# Patient Record
Sex: Male | Born: 1985 | Race: Black or African American | Hispanic: No | Marital: Single | Smoking: Current every day smoker
Health system: Southern US, Community
[De-identification: ages and names within clinical notes are randomized; demographics above are authoritative.]

## PROBLEM LIST (undated history)

## (undated) ENCOUNTER — Ambulatory Visit: Admission: EM | Payer: Self-pay

## (undated) HISTORY — PX: WISDOM TOOTH EXTRACTION: SHX21

---

## 2017-12-23 ENCOUNTER — Emergency Department: Payer: Self-pay

## 2017-12-23 ENCOUNTER — Emergency Department
Admission: EM | Admit: 2017-12-23 | Discharge: 2017-12-23 | Disposition: A | Discharge status: Home / Self Care | Payer: Self-pay | Attending: Emergency Medicine | Admitting: Emergency Medicine

## 2017-12-23 DIAGNOSIS — N50812 Left testicular pain: Secondary | ICD-10-CM | POA: Insufficient documentation

## 2017-12-23 DIAGNOSIS — F17210 Nicotine dependence, cigarettes, uncomplicated: Secondary | ICD-10-CM | POA: Insufficient documentation

## 2017-12-23 DIAGNOSIS — N5082 Scrotal pain: Secondary | ICD-10-CM | POA: Insufficient documentation

## 2017-12-23 LAB — URINALYSIS, COMPLETE (UACMP) WITH MICROSCOPIC
BACTERIA UA: NONE SEEN
BILIRUBIN URINE: NEGATIVE
Glucose, UA: NEGATIVE mg/dL
Hgb urine dipstick: NEGATIVE
KETONES UR: NEGATIVE mg/dL
LEUKOCYTES UA: NEGATIVE
Nitrite: NEGATIVE
Protein, ur: NEGATIVE mg/dL
SPECIFIC GRAVITY, URINE: 1.017 (ref 1.005–1.030)
pH: 6 (ref 5.0–8.0)

## 2017-12-23 LAB — CHLAMYDIA/NGC RT PCR (ARMC ONLY)
CHLAMYDIA TR: NOT DETECTED
N GONORRHOEAE: NOT DETECTED

## 2017-12-23 MED ORDER — KETOROLAC TROMETHAMINE 30 MG/ML IJ SOLN
60.0000 mg | Freq: Once | INTRAMUSCULAR | Status: DC
  Filled 2017-12-23: qty 2

## 2017-12-23 NOTE — Discharge Instructions (Signed)
You were seen for groin/testicular pain but needed to leave prior to results.  We will notify you of any abnormal ultrasound results.  You may take Tylenol and/ibuprofen as needed for discomfort.  Return to the ER for worsening symptoms, persistent vomiting, difficulty breathing or other concerns.

## 2017-12-23 NOTE — ED Notes (Signed)
Patient unable to urinate at this time. Patient instructed to let RN know when able to provide specimen. Patient verbalized understanding of these instructions.
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
## 2017-12-23 NOTE — ED Triage Notes (Signed)
Patient c/o intermittent groin pain X several years. Patient reports pain can be absent for a year and then return. Patient reported pain came back today. Patient denies urinary changes, pain with urination. Patient would like to be checked for STDs.

## 2019-03-21 IMAGING — US US SCROTUM W/ DOPPLER COMPLETE
1 series · 14 of 25 positions shown · non-contrast
Comparison: None.

CLINICAL DATA: Initial evaluation for acute left testicular pain,
with intermittent pain over past 5 years.

EXAM:
SCROTAL ULTRASOUND
DOPPLER ULTRASOUND OF THE TESTICLES
TECHNIQUE: Complete ultrasound examination of the testicles, epididymis, and
other scrotal structures was performed. Color and spectral Doppler
ultrasound were also utilized to evaluate blood flow to the
testicles.

[Series 1: us scrotum w/ doppler complete · 0.08mm/px · 14 of 56 slices shown]
[im 1/56]
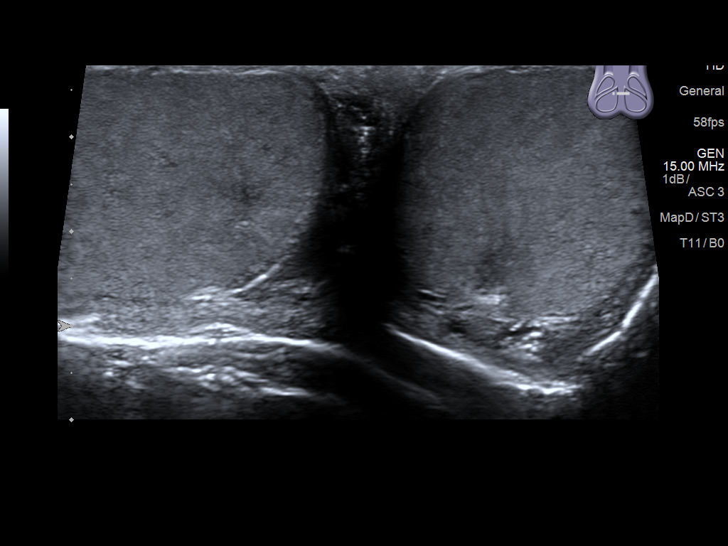
[im 5/56]
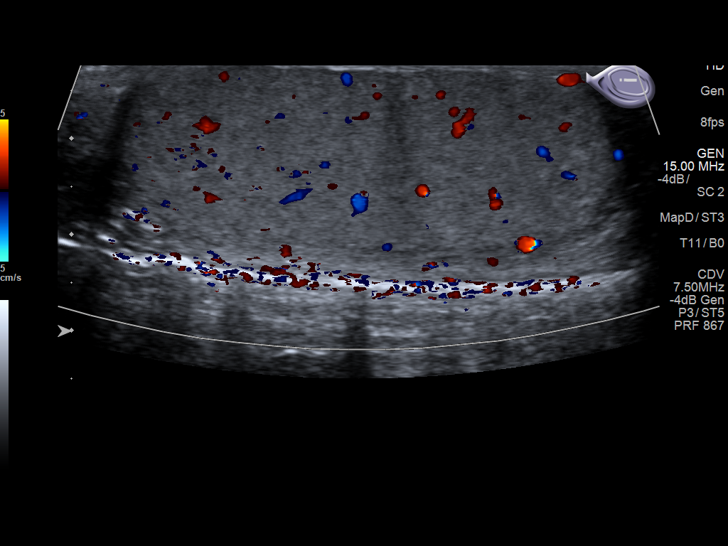
[im 10/56]
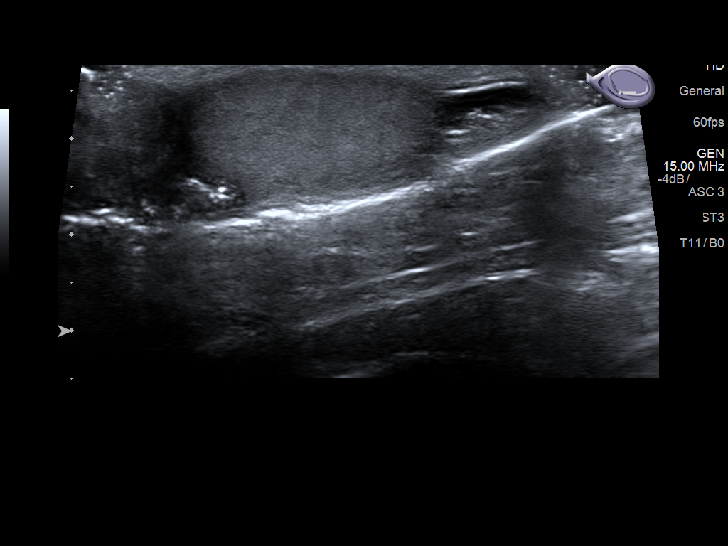
[im 14/56]
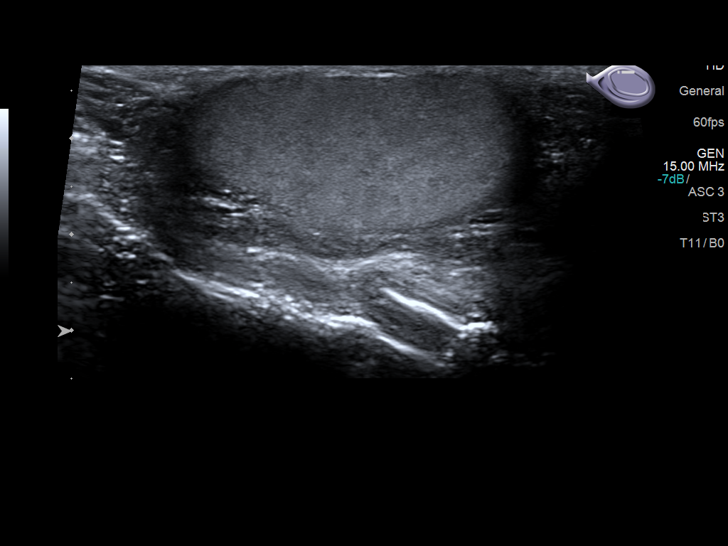
[im 19/56]
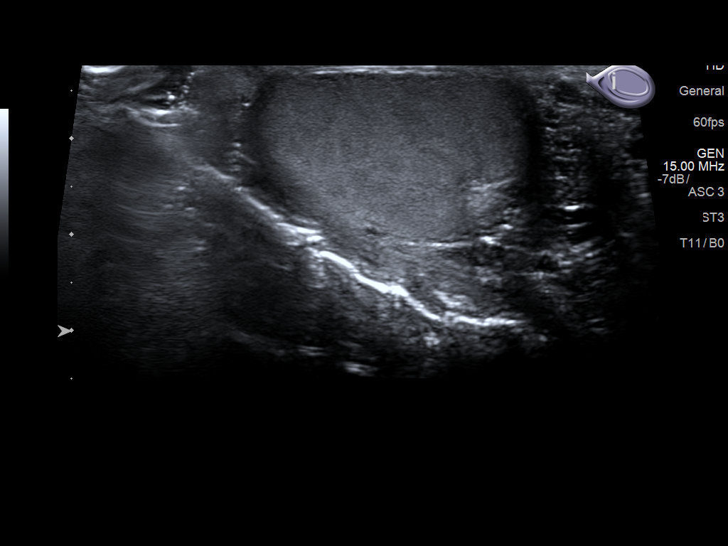
[im 21/56]
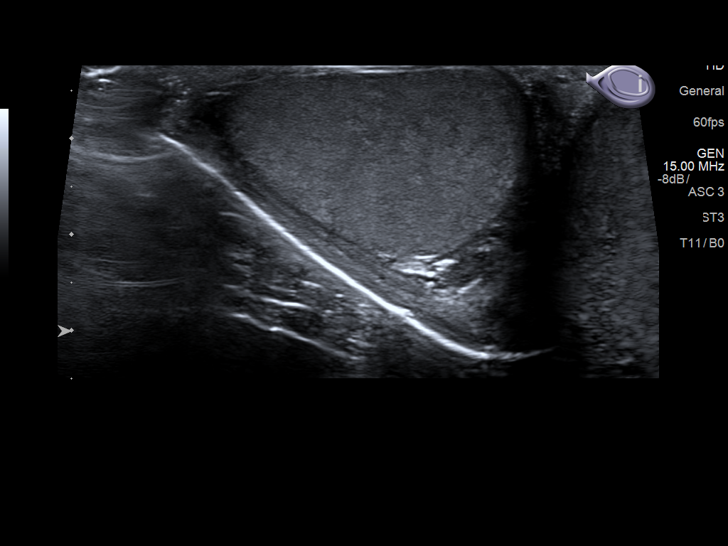
[im 26/56]
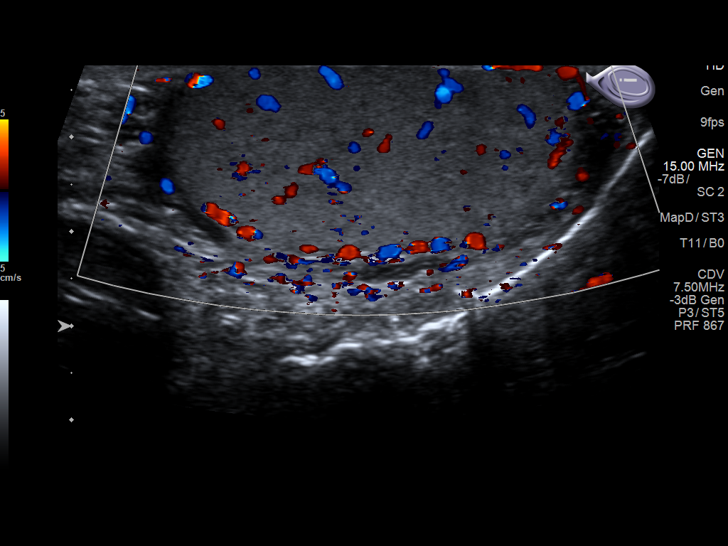
[im 30/56]
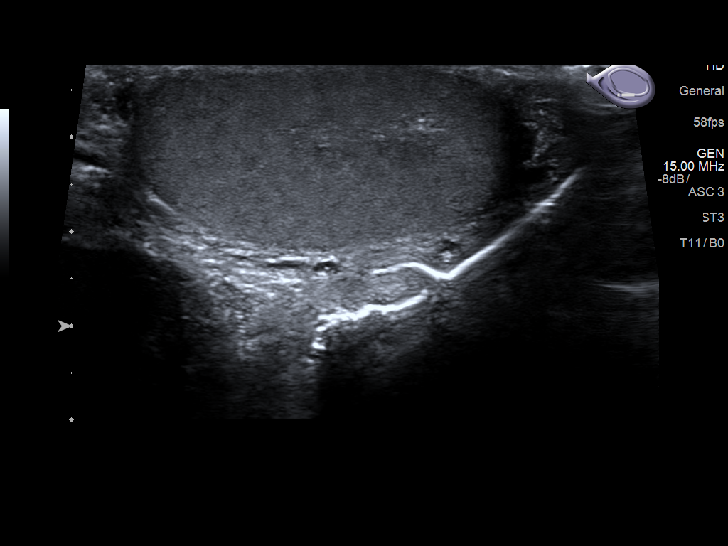
[im 35/56]
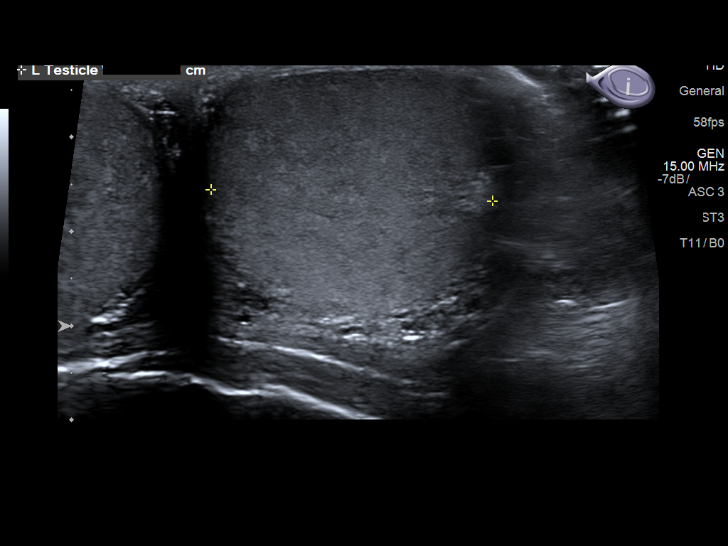
[im 37/56]
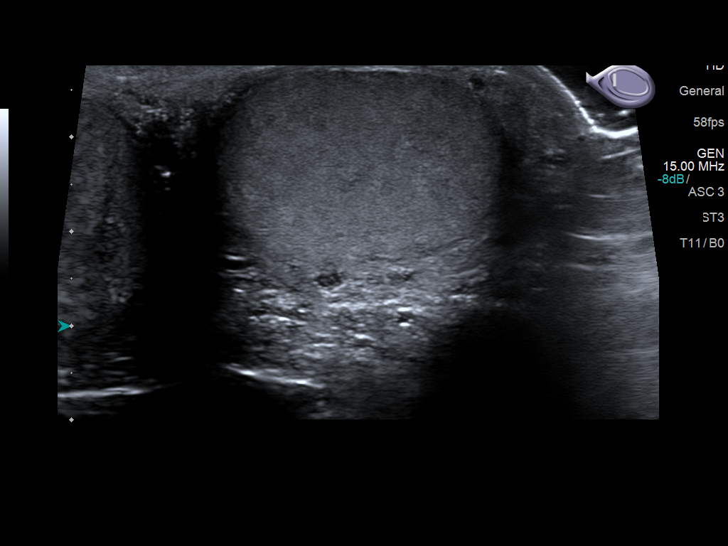
[im 42/56]
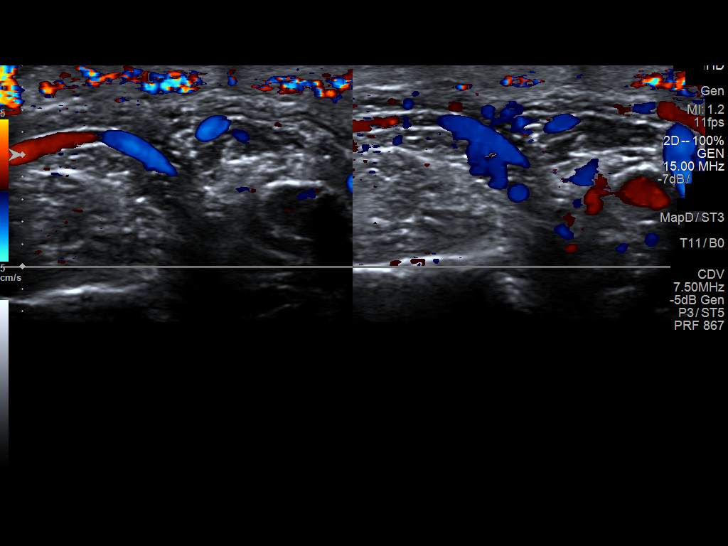
[im 46/56]
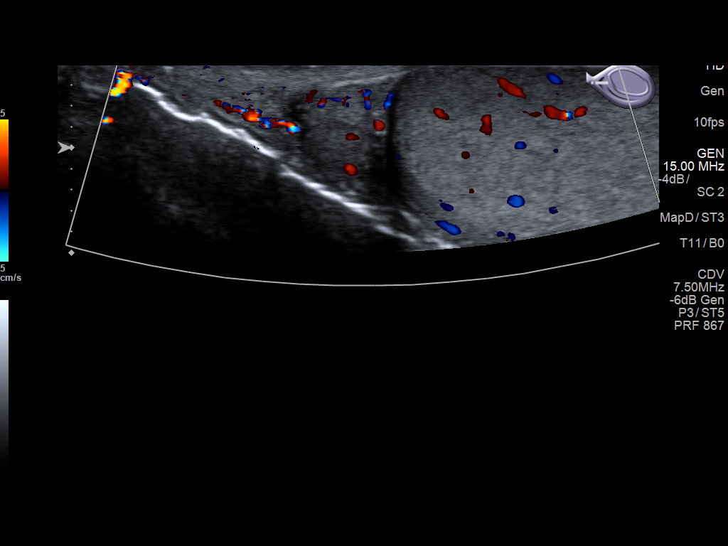
[im 51/56]
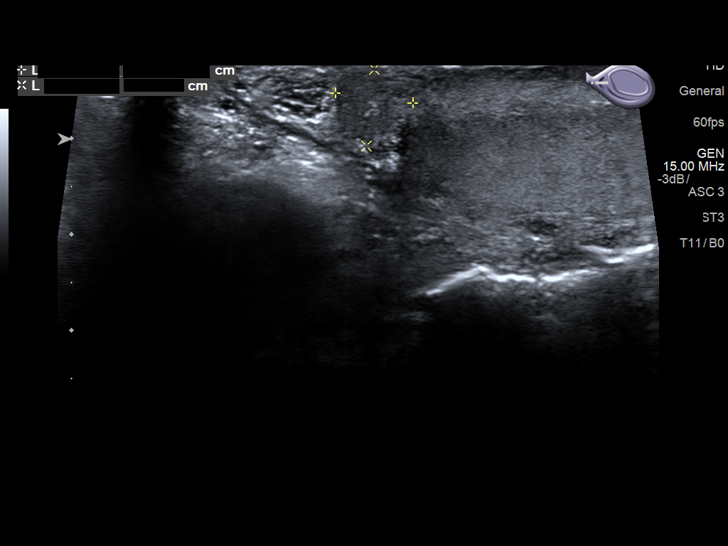
[im 56/56]
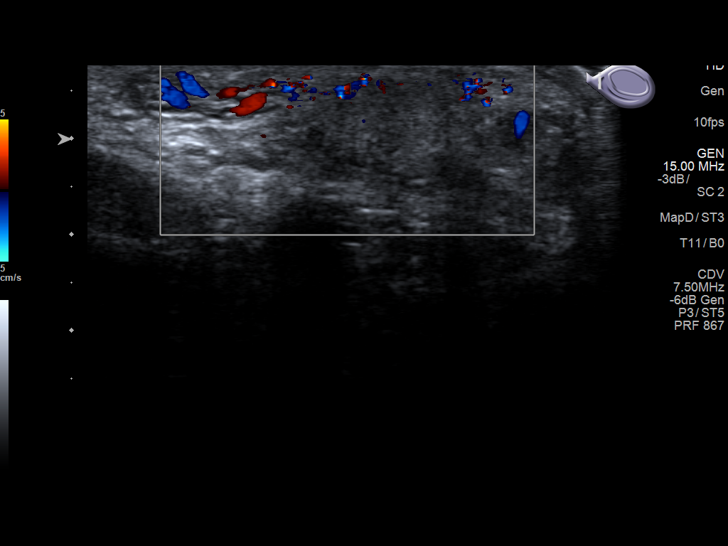

[14 of 25 positions shown; findings below may reference images not displayed]

FINDINGS: Right testicle

Measurements: 4.8 x 2.1 x 3.5 cm. No mass or microlithiasis
visualized.

Left testicle

Measurements: 4.6 x 2.3 x 3.0 cm. No mass or microlithiasis
visualized.

Right epididymis:  Normal in size and appearance.

Left epididymis:  Normal in size and appearance.

Hydrocele:  Trace bilateral hydroceles noted.

Varicocele:  None visualized.

Pulsed Doppler interrogation of both testes demonstrates normal low
resistance arterial and venous waveforms bilaterally.
IMPRESSION: Normal testicular ultrasound. No acute abnormality identified. No
evidence for torsion.

## 2019-04-29 ENCOUNTER — Other Ambulatory Visit: Payer: Self-pay

## 2019-04-29 ENCOUNTER — Emergency Department (HOSPITAL_COMMUNITY)
Admission: EM | Admit: 2019-04-29 | Discharge: 2019-04-30 | Disposition: A | Discharge status: Home / Self Care | Payer: Self-pay | Attending: Emergency Medicine | Admitting: Emergency Medicine

## 2019-04-29 ENCOUNTER — Encounter (HOSPITAL_COMMUNITY): Payer: Self-pay

## 2019-04-29 DIAGNOSIS — Y9389 Activity, other specified: Secondary | ICD-10-CM | POA: Insufficient documentation

## 2019-04-29 DIAGNOSIS — F1210 Cannabis abuse, uncomplicated: Secondary | ICD-10-CM | POA: Insufficient documentation

## 2019-04-29 DIAGNOSIS — Z23 Encounter for immunization: Secondary | ICD-10-CM | POA: Insufficient documentation

## 2019-04-29 DIAGNOSIS — Y998 Other external cause status: Secondary | ICD-10-CM | POA: Insufficient documentation

## 2019-04-29 DIAGNOSIS — F17210 Nicotine dependence, cigarettes, uncomplicated: Secondary | ICD-10-CM | POA: Insufficient documentation

## 2019-04-29 DIAGNOSIS — R61 Generalized hyperhidrosis: Secondary | ICD-10-CM | POA: Insufficient documentation

## 2019-04-29 DIAGNOSIS — W25XXXA Contact with sharp glass, initial encounter: Secondary | ICD-10-CM | POA: Insufficient documentation

## 2019-04-29 DIAGNOSIS — Y929 Unspecified place or not applicable: Secondary | ICD-10-CM | POA: Insufficient documentation

## 2019-04-29 DIAGNOSIS — S91012A Laceration without foreign body, left ankle, initial encounter: Secondary | ICD-10-CM | POA: Insufficient documentation

## 2019-04-29 MED ORDER — FENTANYL CITRATE (PF) 100 MCG/2ML IJ SOLN
75.0000 ug | Freq: Once | INTRAMUSCULAR | Status: AC
  Administered 2019-04-30: 75 ug via INTRAVENOUS
  Filled 2019-04-29: qty 2
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
## 2019-04-30 ENCOUNTER — Emergency Department (HOSPITAL_COMMUNITY): Payer: Self-pay

## 2019-04-30 LAB — TYPE AND SCREEN
ABO/RH(D): A POS
Antibody Screen: NEGATIVE

## 2019-04-30 LAB — CBC
HCT: 39.5 % (ref 39.0–52.0)
Hemoglobin: 13.5 g/dL (ref 13.0–17.0)
MCH: 32.8 pg (ref 26.0–34.0)
MCHC: 34.2 g/dL (ref 30.0–36.0)
MCV: 96.1 fL (ref 80.0–100.0)
Platelets: 260 10*3/uL (ref 150–400)
RBC: 4.11 MIL/uL — ABNORMAL LOW (ref 4.22–5.81)
RDW: 13.1 % (ref 11.5–15.5)
WBC: 8.1 10*3/uL (ref 4.0–10.5)
nRBC: 0 % (ref 0.0–0.2)

## 2019-04-30 LAB — BASIC METABOLIC PANEL
Anion gap: 12 (ref 5–15)
BUN: 17 mg/dL (ref 6–20)
CO2: 19 mmol/L — ABNORMAL LOW (ref 22–32)
Calcium: 9.2 mg/dL (ref 8.9–10.3)
Chloride: 104 mmol/L (ref 98–111)
Creatinine, Ser: 1.57 mg/dL — ABNORMAL HIGH (ref 0.61–1.24)
GFR calc Af Amer: >60 mL/min (ref >60)
GFR calc non Af Amer: 58 mL/min — ABNORMAL LOW (ref >60)
Glucose, Bld: 144 mg/dL — ABNORMAL HIGH (ref 70–99)
Potassium: 3.2 mmol/L — ABNORMAL LOW (ref 3.5–5.1)
Sodium: 135 mmol/L (ref 135–145)

## 2019-04-30 LAB — ABO/RH: ABO/RH(D): A POS

## 2019-04-30 MED ORDER — SODIUM CHLORIDE 0.9 % IV BOLUS
1000.0000 mL | Freq: Once | INTRAVENOUS | Status: AC
  Administered 2019-04-30: 1000 mL via INTRAVENOUS

## 2019-04-30 MED ORDER — DOXYCYCLINE HYCLATE 100 MG PO CAPS: 100.0000 mg | ORAL_CAPSULE | Freq: Two times a day (BID) | ORAL | 0 refills | Status: DC

## 2019-04-30 MED ORDER — OXYCODONE-ACETAMINOPHEN 5-325 MG PO TABS: 1.0000 | ORAL_TABLET | Freq: Three times a day (TID) | ORAL | 0 refills | Status: DC | PRN

## 2019-04-30 MED ORDER — LIDOCAINE-EPINEPHRINE (PF) 2 %-1:200000 IJ SOLN
10.0000 mL | Freq: Once | INTRAMUSCULAR | Status: AC
  Administered 2019-04-30: 10 mL
  Filled 2019-04-30: qty 20

## 2019-04-30 MED ORDER — TETANUS-DIPHTH-ACELL PERTUSSIS 5-2.5-18.5 LF-MCG/0.5 IM SUSP
0.5000 mL | Freq: Once | INTRAMUSCULAR | Status: AC
  Administered 2019-04-30: 02:00:00 0.5 mL via INTRAMUSCULAR
  Filled 2019-04-30: qty 0.5

## 2019-04-30 MED ORDER — FENTANYL CITRATE (PF) 100 MCG/2ML IJ SOLN
75.0000 ug | Freq: Once | INTRAMUSCULAR | Status: AC
  Administered 2019-04-30: 75 ug via INTRAVENOUS
  Filled 2019-04-30: qty 2
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
## 2019-04-30 NOTE — ED Provider Notes (Signed)
Patient seen/examined in the Emergency Department in conjunction with Midlevel Provider McDonald Patient reports laceration to left foot. Exam : Awake alert, appears anxious.  Wound is currently hemostatic.  His left foot is well perfused Plan: plan for wound repair and discharge home   Zadie Rhine, MD 04/30/19 [phone]

## 2019-04-30 NOTE — Discharge Instructions (Addendum)
Thank you for allowing me to care for you today in the Emergency Department.   Your stitches need to be removed and 7 to 10 days.  The top layer of stitches is daily when that needs to be removed.  The rest are absorbable and should dissolve with time.  Although the stitches are removed, will take several months for the skin around the wound to have 100% tensile strength prior to the injury.  You can go to the ER, follow-up with primary care, or go to urgent care to have your stitches removed.  Because of how deep the wound is, it is very important to look for signs or symptoms of infection.  If you develop fever, chills, or if the wound gets red, hot to the touch, or if you start to have thick, mucus-like drainage from the wound, you need to return to the ER for a wound check.  To prevent infection, take 1 tablet of doxycycline, an antibiotic, 2 times daily for the next week.  This has been called into your pharmacy.  To care for your wound at home, keep the area clean and dry.  At least once daily, clean the wound with warm water and soap.  Then apply a topical antibiotic on the skin, such as bacitracin or Neosporin.  Then apply a gauze dressing.  The wound may use more for the first day or 2.  This is normal.  It should not gush of blood.  Make sure to change the dressing anytime it is soiled.  Because of how much tension is on the wounds because of the location, it is very important that you wear the brace that you were given in the ER today.  If you bend your ankle, it is possible that you may rip out your sutures.  Wear the brace 24 hours a day other than if you are cleaning the wound.  Make sure not to bend your ankle when you are cleaning the wound.  You can use the crutches as needed to help you get around while you are wearing the ankle brace to avoid putting pressure or increased tension on the ankle.   For pain control, you can apply an ice pack for 15 to 20 minutes at least 3-4 times a  day.  Try to elevate your leg so that your toes are at or above the level of your heart to help with pain and swelling.  You can take 600 mg of ibuprofen with food or 650 milligrams of Tylenol once every 6 hours for pain control.  You can also alternate between these 2 medications every 3 hours.  If your pain becomes uncontrollable, you can take 1 tablet of Percocet.  Percocet is a narcotic.  It can be addicting.  Each tablet of Percocet contains 325 mg of Tylenol.  You should not consume more than 4000 mg of Tylenol in a 24-hour period.

## 2020-07-21 IMAGING — CR LEFT FOOT - COMPLETE 3+ VIEW
3 series · 3 of 3 positions shown · non-contrast
Comparison: None.

CLINICAL DATA: Foot wound

EXAM:
LEFT FOOT - COMPLETE 3+ VIEW

[foot ap]
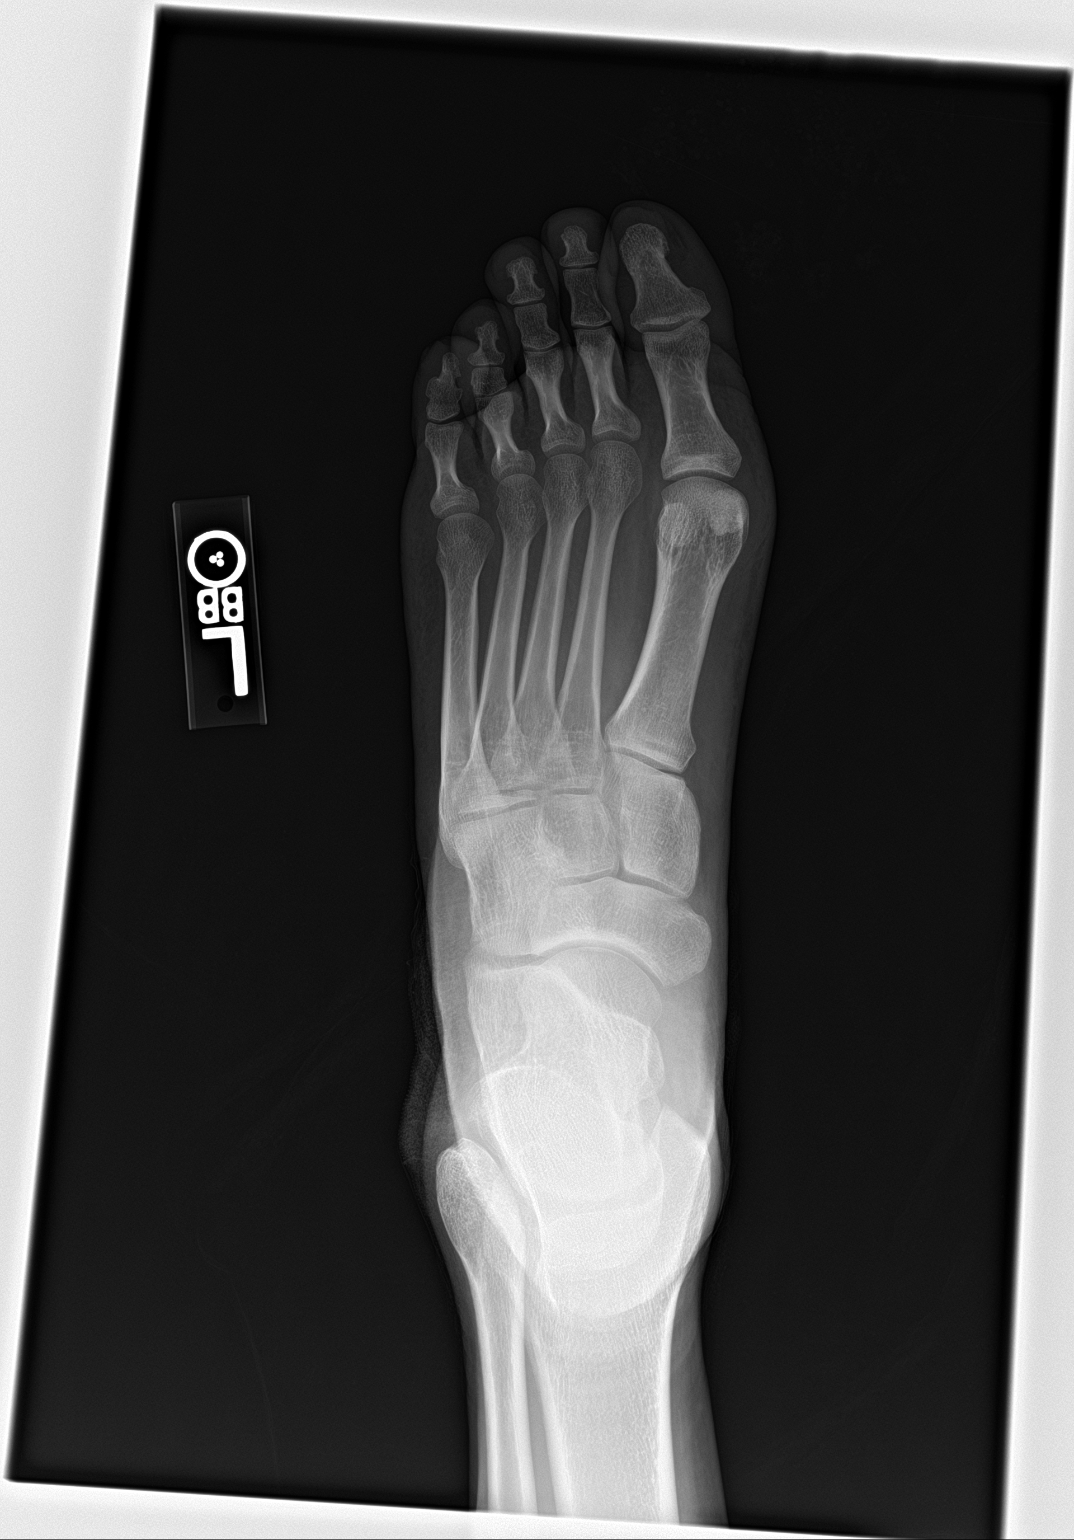

[foot obl]
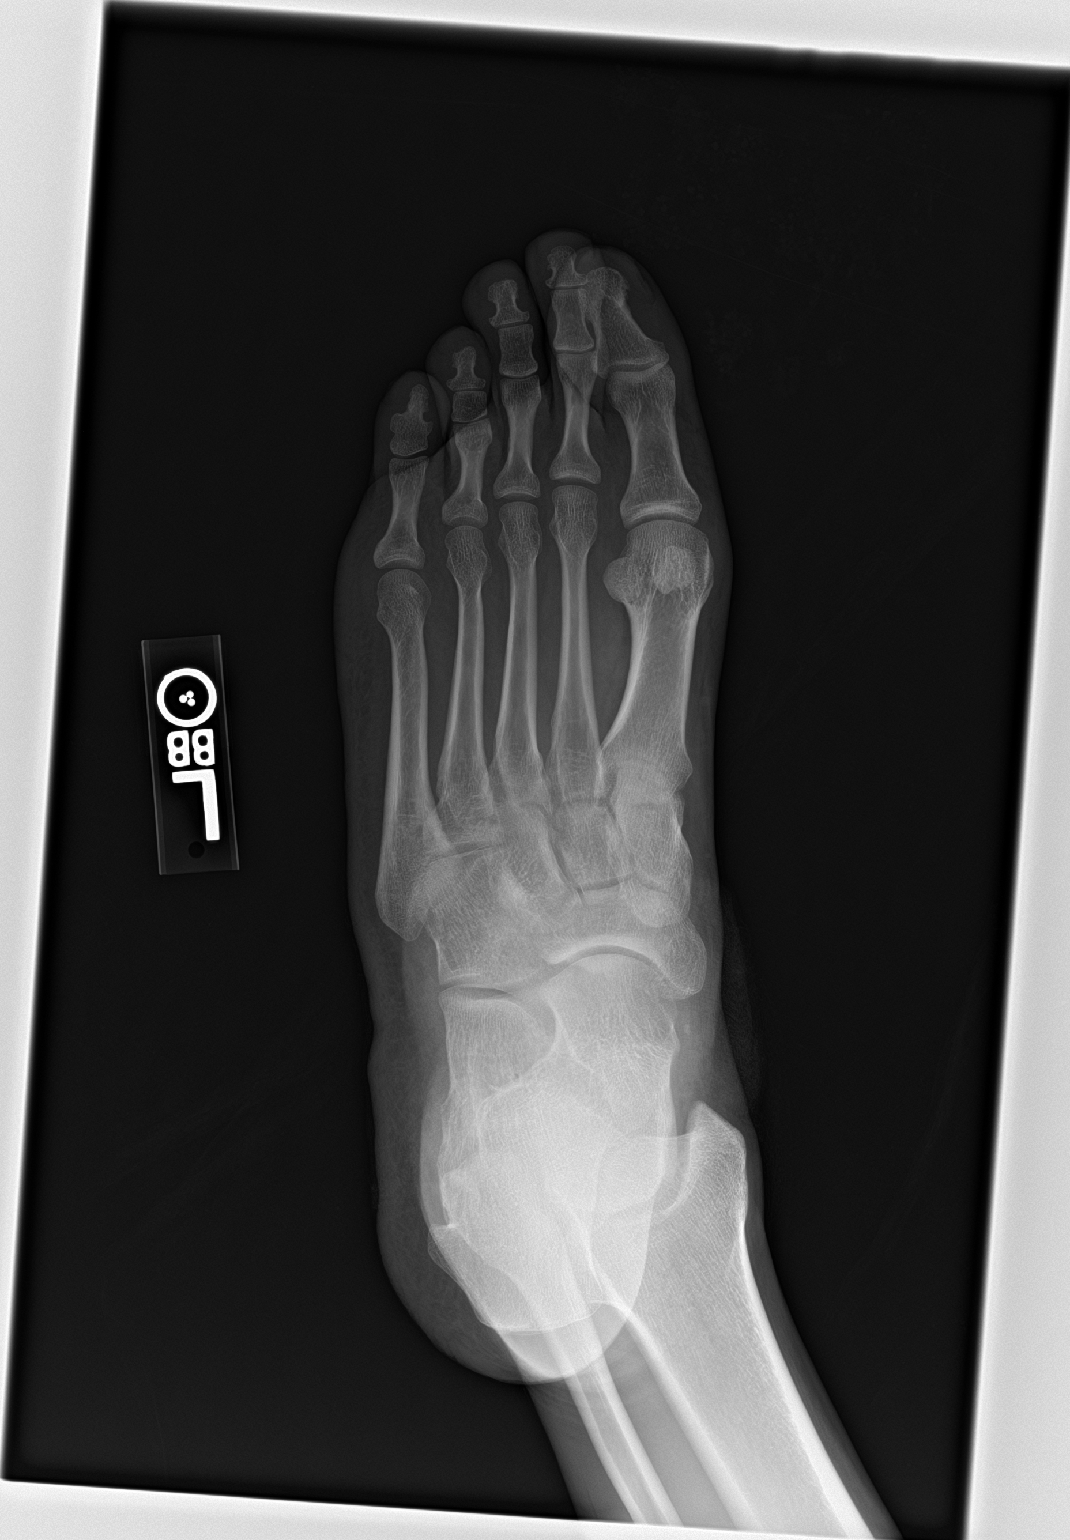

[foot lat]
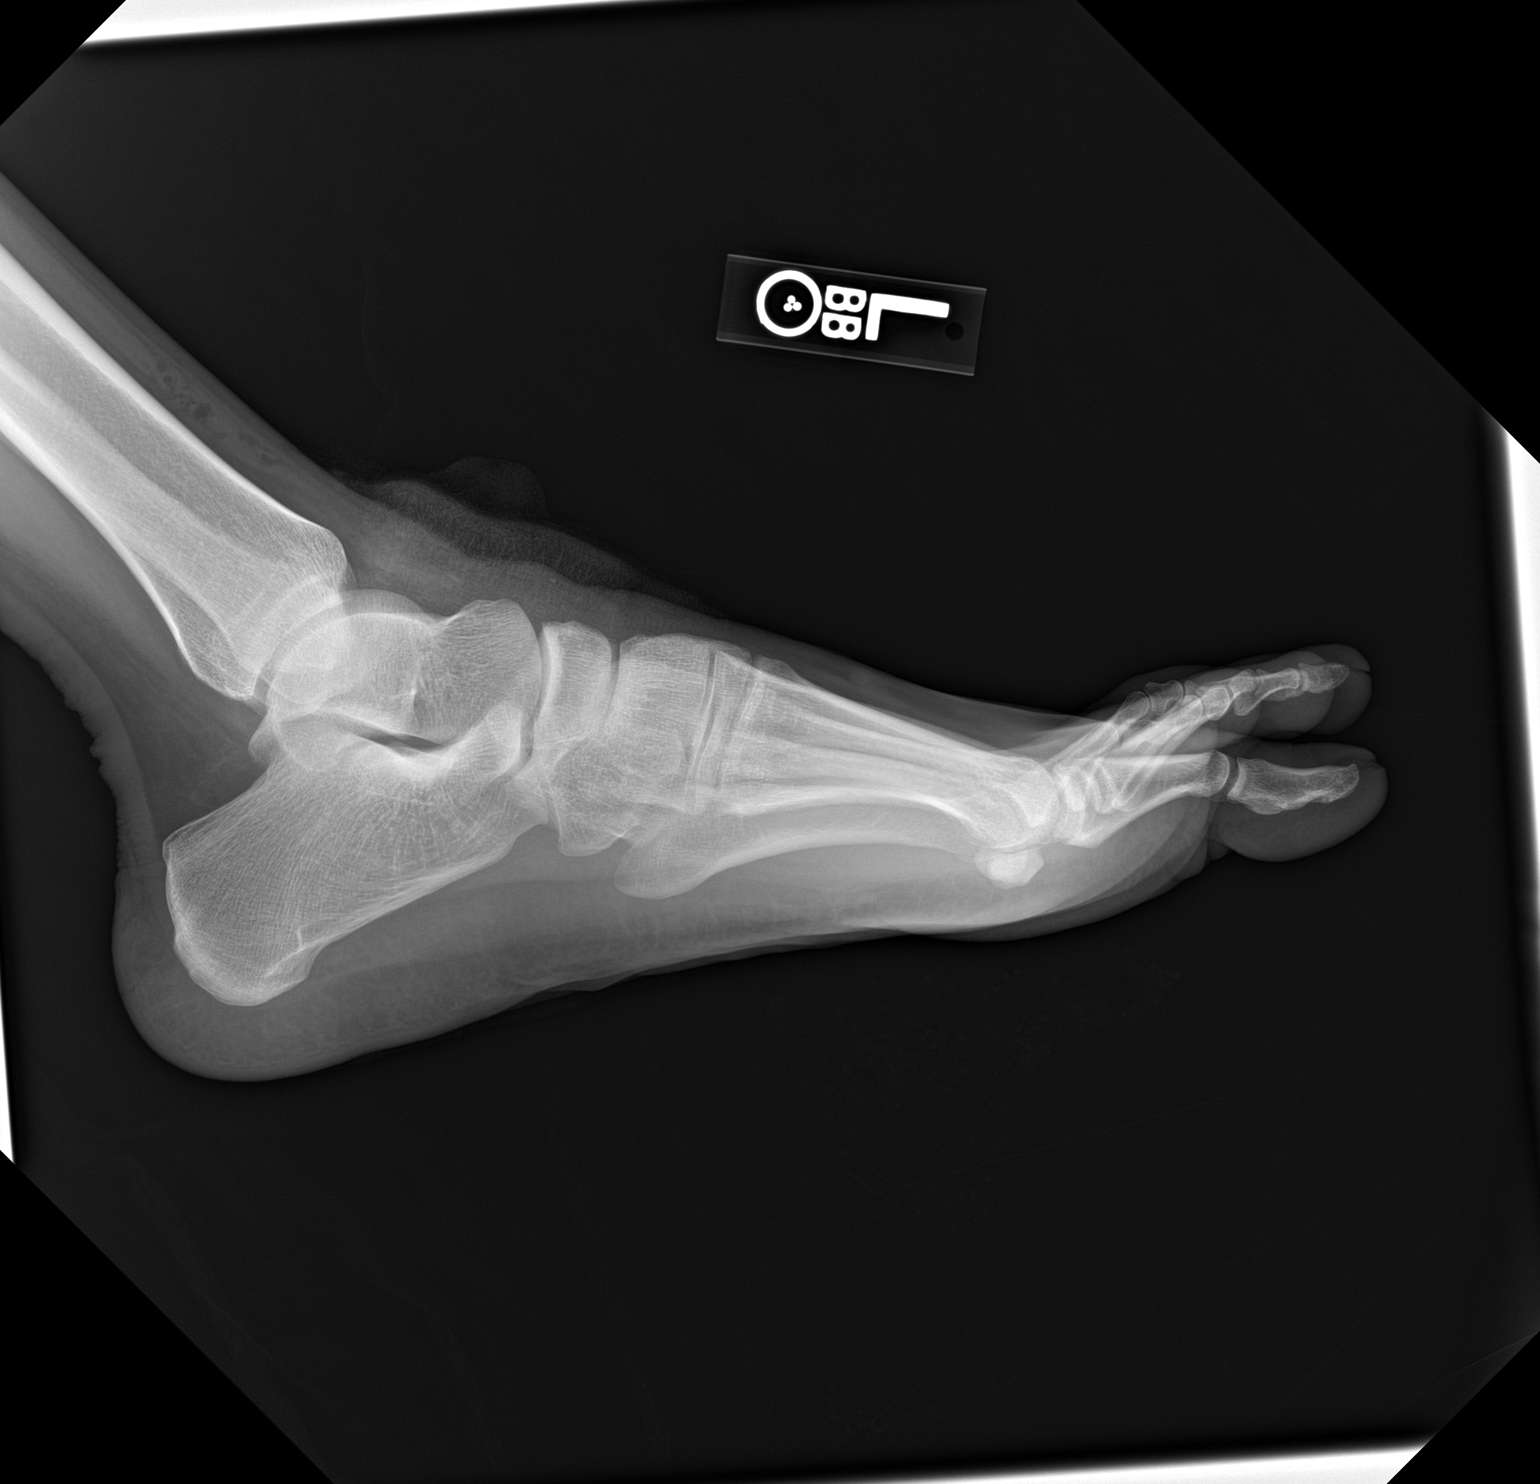

[3 of 3 positions shown; findings below may reference images not displayed]

FINDINGS: There is no evidence of fracture or dislocation. There is no
evidence of arthropathy or other focal bone abnormality. Possible
punctate skin foreign bodies dorsal aspect of the proximal foot. Gas
in the soft tissues anterior to the distal tibia consistent with
laceration
IMPRESSION: No acute osseous abnormality

## 2021-05-07 ENCOUNTER — Ambulatory Visit: Payer: Self-pay | Admitting: Physician Assistant

## 2021-05-07 ENCOUNTER — Other Ambulatory Visit: Payer: Self-pay

## 2021-05-07 ENCOUNTER — Encounter: Payer: Self-pay | Admitting: Physician Assistant

## 2021-05-07 DIAGNOSIS — Z113 Encounter for screening for infections with a predominantly sexual mode of transmission: Secondary | ICD-10-CM

## 2021-05-07 LAB — GRAM STAIN

## 2021-05-07 NOTE — Progress Notes (Signed)
   [facility] Department STI clinic/screening visit  Subjective:  Brandon Hampton is a 35 y.o. male being seen today for an STI screening visit. The patient reports they do not have symptoms.    Patient has the following medical conditions:  There are no problems to display for this patient.    Chief Complaint  Patient presents with   SEXUALLY TRANSMITTED DISEASE    Man here for STI eval. Heard a male partner may have had STI symptoms (vaginal discharge) recently. Pt himself is asymptomatic.   Patient reports possible exposure (see HPI).   See flowsheet for further details and programmatic requirements.    The following portions of the patient's history were reviewed and updated as appropriate: allergies, current medications, past medical history, past social history, past surgical history and problem list.  Objective:  There were no vitals filed for this visit.  Physical Exam Constitutional:      Appearance: Normal appearance.  HENT:     Head: Normocephalic and atraumatic.     Comments: No nits or hair loss    Mouth/Throat:     Mouth: Mucous membranes are moist.     Pharynx: Oropharynx is clear. No oropharyngeal exudate or posterior oropharyngeal erythema.  Pulmonary:     Effort: Pulmonary effort is normal.  Chest:  Breasts:    Right: No axillary adenopathy or supraclavicular adenopathy.     Left: No axillary adenopathy or supraclavicular adenopathy.  Abdominal:     General: Abdomen is flat.     Palpations: Abdomen is soft. There is no hepatomegaly or mass.     Tenderness: There is no abdominal tenderness.  Genitourinary:    Pubic Area: No rash or pubic lice.      Penis: Normal and circumcised.      Testes: Normal.     Epididymis:     Right: Normal.     Left: Normal.     Rectum: Normal.  Lymphadenopathy:     Head:     Right side of head: No preauricular or posterior auricular adenopathy.     Left side of head: No preauricular or posterior  auricular adenopathy.     Cervical: No cervical adenopathy.     Upper Body:     Right upper body: No supraclavicular or axillary adenopathy.     Left upper body: No supraclavicular or axillary adenopathy.     Lower Body: No right inguinal adenopathy. No left inguinal adenopathy.  Skin:    General: Skin is warm and dry.     Findings: No rash.  Neurological:     Mental Status: He is alert and oriented to person, place, and time.      Assessment and Plan:  Tarron Krolak is a 35 y.o. male presenting to the [facility] Department for STI screening  1. Routine screening for STI (sexually transmitted infection) Gram stain neg. Await remaining results. Enc condoms with all sex. - Gram stain - HIV/HCV [place] [facility] - Syphilis Serology, [facility] - Gonococcus culture - pharynx - Gonococcus culture - urethra  2. Nicotine use Has switched from cigarettes to vaping this year, now planning to quit completely next month. I encouraged this and provided [place] Quitline resource.   Return in about 6 months (around 11/06/2021) for STI screening.  No future appointments.  Landry Dyke, PA-C

## 2021-05-07 NOTE — Progress Notes (Signed)
Gram stain reviewed and no treatment indicated per standing order. Jossie Ng, RN

## 2021-05-12 LAB — GONOCOCCUS CULTURE

## 2021-07-30 ENCOUNTER — Encounter: Payer: Self-pay | Admitting: Family Medicine

## 2021-07-30 ENCOUNTER — Other Ambulatory Visit: Payer: Self-pay

## 2021-07-30 ENCOUNTER — Ambulatory Visit: Payer: Self-pay | Admitting: Family Medicine

## 2021-07-30 DIAGNOSIS — Z113 Encounter for screening for infections with a predominantly sexual mode of transmission: Secondary | ICD-10-CM

## 2021-07-30 DIAGNOSIS — Z202 Contact with and (suspected) exposure to infections with a predominantly sexual mode of transmission: Secondary | ICD-10-CM

## 2021-07-30 MED ORDER — DOXYCYCLINE HYCLATE 100 MG PO TABS: 100.0000 mg | ORAL_TABLET | Freq: Two times a day (BID) | ORAL | 0 refills | Status: AC

## 2021-07-30 NOTE — Progress Notes (Signed)
[facility] Department STI clinic/screening visit  Subjective:  Brandon Hampton is a 35 y.o. male being seen today for an STI screening visit. The patient reports they do not have symptoms.    Patient has the following medical conditions:  There are no problems to display for this patient.    Chief Complaint  Patient presents with   SEXUALLY TRANSMITTED DISEASE    Screening     HPI  Patient reports here for screening, denies s/sx, contact to chlamydia   Does the patient or their partner desires a pregnancy in the next year? No  Screening for MPX risk: Does the patient have an unexplained rash? No Is the patient MSM? No Does the patient endorse multiple sex partners or anonymous sex partners? No Did the patient have close or sexual contact with a person diagnosed with MPX? No Has the patient traveled outside the [place] where MPX is endemic? No Is there a high clinical suspicion for MPX-- evidenced by one of the following No  -Unlikely to be chickenpox  -Lymphadenopathy  -Rash that present in same phase of evolution on any given body part   See flowsheet for further details and programmatic requirements.    The following portions of the patient's history were reviewed and updated as appropriate: allergies, current medications, past medical history, past social history, past surgical history and problem list.  Objective:  There were no vitals filed for this visit.  Physical Exam Constitutional:      Appearance: Normal appearance.  HENT:     Head: Normocephalic.     Mouth/Throat:     Mouth: Mucous membranes are moist.     Pharynx: Oropharynx is clear. No oropharyngeal exudate.  Pulmonary:     Effort: Pulmonary effort is normal.  Genitourinary:    Penis: Normal.      Testes: Normal.     Comments: No lice, nits, or pest, no lesions or odor discharge.  Denies pain or tenderness with paplation of testicles.  No lesions, ulcers or masses present.     Musculoskeletal:     Cervical back: Normal range of motion and neck supple.  Lymphadenopathy:     Cervical: No cervical adenopathy.  Skin:    General: Skin is warm and dry.     Findings: No bruising, erythema, lesion or rash.  Neurological:     Mental Status: He is alert and oriented to person, place, and time.  Psychiatric:        Mood and Affect: Mood normal.        Behavior: Behavior normal.      Assessment and Plan:  Brandon Hampton is a 35 y.o. male presenting to the [facility] Department for STI screening  1. Screening examination for venereal disease Patient does not have STI symptoms Patient accepted all screenings including   urine CT/GC, oral  and bloodwork for HIV/RPR.  Patient meets criteria for HepB screening? No. Ordered? No - declined  Patient meets criteria for HepC screening? Yes. Ordered? No - declined  Recommended condom use with all sex Discussed importance of condom use for STI prevent  Discussed time line for [facility] results and that patient will be called with positive results and encouraged patient to call if he had not heard in 2 weeks Recommended returning for continued or worsening symptoms.  - Chlamydia/Gonorrhea [place] [facility] - Gonococcus culture  2. Exposure to chlamydia Pt treated as contact to chlamydia  - doxycycline (VIBRA-TABS) 100 MG tablet; Take 1  tablet (100 mg total) by mouth 2 (two) times daily for 7 days.  Dispense: 14 tablet; Refill: 0   No follow-ups on file.  No future appointments.  Wendi Snipes, FNP

## 2021-08-04 ENCOUNTER — Ambulatory Visit: Payer: Self-pay

## 2021-08-04 LAB — GONOCOCCUS CULTURE

## 2022-04-02 ENCOUNTER — Other Ambulatory Visit: Payer: Self-pay

## 2022-04-02 ENCOUNTER — Emergency Department
Admission: EM | Admit: 2022-04-02 | Discharge: 2022-04-02 | Disposition: A | Discharge status: Home / Self Care | Payer: Self-pay | Attending: Emergency Medicine | Admitting: Emergency Medicine

## 2022-04-02 DIAGNOSIS — R21 Rash and other nonspecific skin eruption: Secondary | ICD-10-CM

## 2022-04-02 LAB — BASIC METABOLIC PANEL
Anion gap: 6 (ref 5–15)
BUN: 13 mg/dL (ref 6–20)
CO2: 27 mmol/L (ref 22–32)
Calcium: 9.1 mg/dL (ref 8.9–10.3)
Chloride: 104 mmol/L (ref 98–111)
Creatinine, Ser: 0.99 mg/dL (ref 0.61–1.24)
GFR, Estimated: >60 mL/min (ref >60)
Glucose, Bld: 98 mg/dL (ref 70–99)
Potassium: 4 mmol/L (ref 3.5–5.1)
Sodium: 137 mmol/L (ref 135–145)

## 2022-04-02 LAB — CBC WITH DIFFERENTIAL/PLATELET
Abs Immature Granulocytes: 0.04 10*3/uL (ref 0.00–0.07)
Basophils Absolute: 0 10*3/uL (ref 0.0–0.1)
Basophils Relative: 0 %
Eosinophils Absolute: 0.2 10*3/uL (ref 0.0–0.5)
Eosinophils Relative: 2 %
HCT: 45.5 % (ref 39.0–52.0)
Hemoglobin: 15 g/dL (ref 13.0–17.0)
Immature Granulocytes: 1 %
Lymphocytes Relative: 16 %
Lymphs Abs: 1.3 10*3/uL (ref 0.7–4.0)
MCH: 32.5 pg (ref 26.0–34.0)
MCHC: 33 g/dL (ref 30.0–36.0)
MCV: 98.7 fL (ref 80.0–100.0)
Monocytes Absolute: 0.7 10*3/uL (ref 0.1–1.0)
Monocytes Relative: 9 %
Neutro Abs: 6 10*3/uL (ref 1.7–7.7)
Neutrophils Relative %: 72 %
Platelets: 297 10*3/uL (ref 150–400)
RBC: 4.61 MIL/uL (ref 4.22–5.81)
RDW: 13.3 % (ref 11.5–15.5)
WBC: 8.3 10*3/uL (ref 4.0–10.5)
nRBC: 0 % (ref 0.0–0.2)

## 2022-04-02 LAB — HIV ANTIBODY (ROUTINE TESTING W REFLEX): HIV Screen 4th Generation wRfx: NONREACTIVE

## 2022-04-02 LAB — CHLAMYDIA/NGC RT PCR (ARMC ONLY)
Chlamydia Tr: NOT DETECTED
N gonorrhoeae: NOT DETECTED

## 2022-04-02 MED ORDER — HYDROXYZINE PAMOATE 100 MG PO CAPS: 100.0000 mg | ORAL_CAPSULE | Freq: Three times a day (TID) | ORAL | 0 refills | Status: AC | PRN

## 2022-04-02 MED ORDER — PREDNISONE 10 MG PO TABS: ORAL_TABLET | ORAL | 0 refills | Status: AC

## 2022-04-02 NOTE — ED Provider Notes (Signed)
? ?[facility] ?Provider Note ? ? ? Event Date/Time  ? First MD Initiated Contact with Patient 04/02/22 1609   ?  (approximate) ? ? ?History  ? ?Chief Complaint ?Rash ? ? ?HPI ?Brandon Hampton is a 36 y.o. male, no remarkable medical history, presents to the emergency department for evaluation of rash.  He states that he recently stayed in a air bnb 2 weeks ago and woke up with a rash along his chest, back, and upper extremities.  He was initially seen at [facility] where he was prescribed a 5-day course of steroids, however this is not helped.  He states that the rash itches all over.  He is additionally requesting STD testing, though is unsure if this is related.  Denies fever/chills, myalgias, chest pain, shortness of breath, penile discharge, genital lesions, abdominal pain, flank pain, vision changes, hearing changes, numbness/tingling upper or lower extremities, abdominal pain, nausea/vomiting, or diarrhea.  Denies any new body washes, soaps, lotions, detergents, or fabric softeners.  No known environmental exposures.  No new medications or foods. ? ?History Limitations: No limitations. ? ?    ? ? ?Physical Exam  ?Triage Vital Signs: ?ED Triage Vitals  ?Enc Vitals Group  ?   BP 04/02/22 1518 121/85  ?   Pulse Rate 04/02/22 1518 73  ?   Resp 04/02/22 1518 16  ?   Temp 04/02/22 1518 98.2 ?F (36.8 ?C)  ?   Temp Source 04/02/22 1518 Oral  ?   SpO2 04/02/22 1518 98 %  ?   Weight --   ?   Height 04/02/22 1530 6\' 1"  (1.854 m)  ?   Head Circumference --   ?   Peak Flow --   ?   Pain Score 04/02/22 1530 0  ?   Pain Loc --   ?   Pain Edu? --   ?   Excl. in GC? --   ? ? ?Most recent vital signs: ?Vitals:  ? 04/02/22 1518  ?BP: 121/85  ?Pulse: 73  ?Resp: 16  ?Temp: 98.2 ?F (36.8 ?C)  ?SpO2: 98%  ? ? ?General: Awake, NAD.  ?Skin: Warm, dry. No rashes or lesions.  ?Eyes: PERRL. Conjunctivae normal.  ?CV: Good peripheral perfusion.  ?Resp: Normal effort.  ?Abd: Soft, non-tender. No distention.  ?Neuro: At  baseline. No gross neurological deficits.  ? ?Focused Exam: Scattered, bumpy, maculopapular rash along the torso, abdomen, back, upper extremities, neck, and proximal aspect of the lower extremities.  No active bleeding or discharge.  ? ?Physical Exam ? ? ? ?ED Results / Procedures / Treatments  ?Labs ?(all labs ordered are listed, but only abnormal results are displayed) ?Labs Reviewed  ?CHLAMYDIA/NGC RT PCR ([facility] ONLY)            ?CBC WITH DIFFERENTIAL/PLATELET  ?BASIC METABOLIC PANEL  ?RPR  ?HIV ANTIBODY (ROUTINE TESTING W REFLEX)  ? ? ? ?EKG ?N/A. ? ? ?RADIOLOGY ? ?ED Provider Interpretation: N/A. ? ?No results found. ? ?PROCEDURES: ? ?Critical Care performed: N/A. ? ?Procedures ? ? ? ?MEDICATIONS ORDERED IN ED: ?Medications - No data to display ? ? ?IMPRESSION / MDM / ASSESSMENT AND PLAN / ED COURSE  ?I reviewed the triage vital signs and the nursing notes. ?             ?               ? ?Differential diagnosis includes, but is not limited to, contact dermatitis, allergic skin reaction, syphilis, viral  exanthem, urticaria ? ? ?ED Course ?Patient appears well, vitals within normal limits.  Afebrile.  NAD. ? ?Assessment/Plan ?Presentation suspicious for contact dermatitis versus urticaria/allergic skin reaction.  Unknown etiology at this time, though may be related to the fabric softeners or detergents utilized at the air bnb 2 weeks prior.  Lab work-up reassuring for no evidence of serious or life-threatening pathology.  It appears the initial prednisone course was subtherapeutic.  We will provide a longer course taper and provide prescription for hydroxyzine to treat the pruritus.  We will additionally provide him with a referral to dermatology to follow-up with in case he is methods fail to improve his symptoms.  Very low suspicion for syphilis, though these results are pending at this time.  Advised him to follow-up with the results on his MyChart and return to the emergency department or follow-up with his  primary care provider if you test positive for any of these STDs.  Patient was amenable to this plan. ? ?Considered admission for this patient, but given the patient's stable vitals, unremarkable lab work-up, he is unlikely to benefit. ? ?Provided the patient with anticipatory guidance, return precautions, and educational material. Encouraged the patient to return to the emergency department at any time if they begin to experience any new or worsening symptoms. Patient expressed understanding and agreed with the plan.  ? ?  ? ? ?FINAL CLINICAL IMPRESSION(S) / ED DIAGNOSES  ? ?Final diagnoses:  ?Rash  ? ? ? ?Rx / DC Orders  ? ?ED Discharge Orders   ? ?      Ordered  ?  predniSONE (DELTASONE) 10 MG tablet       ? 04/02/22 1743  ?  hydrOXYzine (VISTARIL) 100 MG capsule  3 times daily PRN       ? 04/02/22 1743  ? ?  ?  ? ?  ? ? ? ?Note:  This document was prepared using Dragon voice recognition software and may include unintentional dictation errors. ?  ?Varney Daily, [place] ?04/02/22 1804 ? ?  ?Chesley Noon, MD ?04/02/22 1937 ? ?

## 2022-04-02 NOTE — ED Triage Notes (Addendum)
Patient to ER via POV with complaints of rash present to hands/ trunk/neck/ hair/ back. Reports staying in an air bnb two weeks ago and waking up with a rash. Patient was seen at [facility] and prescribed a steroid but this did not help. Patient complains of itching all over.  ? ?Patient also requesting STD testing.  ?

## 2022-04-02 NOTE — Discharge Instructions (Addendum)
-  Take the steroids as prescribed.  You may take hydroxyzine as needed for the itchiness. ? ?-Call the dermatology office listed above and schedule an appointment.   ? ?-Return to the emergency department anytime if you begin to experience any new or worsening symptoms. ? ?-You may follow-up with your STD testing results on MyChart and return here or follow-up with your primary care provider as needed ?

## 2022-04-02 NOTE — ED Notes (Signed)
PT provided with dc ppw. Pt  questions answered.  Pt follow up and rx information reviewed. Pt  declines vs at dc. Pt  provides verbal consent for dc and is ambulatory to lobby on foot alert and oriented x4. ?

## 2022-04-02 NOTE — ED Notes (Signed)
Pt ambulatory to room today with CO rash for weeks after staying at an air bnb. Pt has been seen at [facility] and was given medrol dose pack. Pt had had no relief and the rash still covers their entire body. Pt alert and oriented in NAD ?

## 2022-04-03 LAB — RPR: RPR Ser Ql: NONREACTIVE

## 2022-07-31 ENCOUNTER — Other Ambulatory Visit: Payer: Self-pay

## 2022-07-31 ENCOUNTER — Encounter: Payer: Self-pay | Admitting: Emergency Medicine

## 2022-07-31 ENCOUNTER — Emergency Department
Admission: EM | Admit: 2022-07-31 | Discharge: 2022-07-31 | Disposition: A | Discharge status: Home / Self Care | Payer: Self-pay | Attending: Emergency Medicine | Admitting: Emergency Medicine

## 2022-07-31 ENCOUNTER — Emergency Department: Payer: Self-pay

## 2022-07-31 DIAGNOSIS — J45909 Unspecified asthma, uncomplicated: Secondary | ICD-10-CM | POA: Insufficient documentation

## 2022-07-31 DIAGNOSIS — U071 COVID-19: Secondary | ICD-10-CM | POA: Insufficient documentation

## 2022-07-31 LAB — RESP PANEL BY RT-PCR (FLU A&B, COVID) ARPGX2
Influenza A by PCR: NEGATIVE
Influenza B by PCR: NEGATIVE
SARS Coronavirus 2 by RT PCR: POSITIVE — AB

## 2022-07-31 MED ORDER — NAPROXEN 500 MG PO TABS: 500.0000 mg | ORAL_TABLET | Freq: Two times a day (BID) | ORAL | 0 refills | Status: DC

## 2022-07-31 MED ORDER — PROMETHAZINE-CODEINE 6.25-10 MG/5ML PO SYRP: 5.0000 mL | ORAL_SOLUTION | Freq: Four times a day (QID) | ORAL | 0 refills | Status: DC | PRN

## 2022-07-31 NOTE — Discharge Instructions (Signed)
You may take Tylenol in addition to the medications prescribed for pain or fever.

## 2022-07-31 NOTE — ED Triage Notes (Signed)
Pt reports productive cough, back pain, sore throat, chills and nasal congestion since Thursday,

## 2022-07-31 NOTE — ED Notes (Signed)
D/c by provider

## 2022-07-31 NOTE — ED Provider Notes (Signed)
[facility] Provider Note    None    (approximate)   History   Sore Throat, Chills, and Cough   HPI  Brandon Hampton is a 36 y.o. male presents to the emergency department for treatment and evaluation of cough, back pain, sore throat, chills, nasal congestion for the past 3 days.  No alleviating measures attempted prior to arrival.  No significant past medical history including asthma.  He vapes but does not smoke.      History reviewed. No pertinent past medical history.   Physical Exam   Triage Vital Signs: ED Triage Vitals  Enc Vitals Group     BP 07/31/22 1542 114/83     Pulse Rate 07/31/22 1542 79     Resp 07/31/22 1542 20     Temp 07/31/22 1542 98.7 F (37.1 C)     Temp Source 07/31/22 1542 Oral     SpO2 07/31/22 1542 100 %     Weight 07/31/22 1539 167 lb 8.8 oz (76 kg)     Height 07/31/22 1539 6\' 1"  (1.854 m)     Head Circumference --      Peak Flow --      Pain Score 07/31/22 1539 6     Pain Loc --      Pain Edu? --      Excl. in GC? --     Most recent vital signs: Vitals:   07/31/22 1542  BP: 114/83  Pulse: 79  Resp: 20  Temp: 98.7 F (37.1 C)  SpO2: 100%    General: Awake, no distress.  CV:  Good peripheral perfusion.  Resp:  Normal effort.  Breath sounds clear to auscultation Abd:  No distention.  Other:     ED Results / Procedures / Treatments   Labs (all labs ordered are listed, but only abnormal results are displayed) Labs Reviewed  RESP PANEL BY RT-PCR (FLU A&B, COVID) ARPGX2 - Abnormal; Notable for the following components:      Result Value   SARS Coronavirus 2 by RT PCR POSITIVE (*)    All other components within normal limits     EKG     RADIOLOGY  Not indicated  I have independently reviewed and interpreted imaging as well as reviewed report from radiology.  PROCEDURES:  Critical Care performed: No  Procedures   MEDICATIONS ORDERED IN ED:  Medications - No data to  display   IMPRESSION / MDM / ASSESSMENT AND PLAN / ED COURSE   I reviewed the triage vital signs and the nursing notes.  Differential diagnosis includes, but is not limited to: COVID, influenza, acute viral syndrome  Patient's presentation is most consistent with acute complicated illness / injury requiring diagnostic workup.  36 year old male presenting to the emergency department for treatment and evaluation of symptoms as described in the HPI.  COVID test is positive.  Exam is reassuring.  Plan will be to treat him with cough suppressant and Naprosyn.  Work excuse provided.  He is to see primary care if not improving over the week.  If symptoms change or worsen and he is unable to get an appointment, he is to return to the emergency department.      FINAL CLINICAL IMPRESSION(S) / ED DIAGNOSES   Final diagnoses:  COVID     Rx / DC Orders   ED Discharge Orders          Ordered    promethazine-codeine (PHENERGAN WITH CODEINE) 6.25-10 MG/5ML syrup  Every 6 hours PRN        07/31/22 1703    naproxen (NAPROSYN) 500 MG tablet  2 times daily with meals        07/31/22 1703             Note:  This document was prepared using Dragon voice recognition software and may include unintentional dictation errors.   Chinita Pester, FNP 08/01/22 0011    Pilar Jarvis, MD 08/01/22 1124

## 2022-10-04 ENCOUNTER — Emergency Department
Admission: EM | Admit: 2022-10-04 | Discharge: 2022-10-04 | Disposition: A | Discharge status: Home / Self Care | Payer: Self-pay | Attending: Emergency Medicine | Admitting: Emergency Medicine

## 2022-10-04 ENCOUNTER — Other Ambulatory Visit: Payer: Self-pay

## 2022-10-04 DIAGNOSIS — K529 Noninfective gastroenteritis and colitis, unspecified: Secondary | ICD-10-CM | POA: Insufficient documentation

## 2022-10-04 MED ORDER — ONDANSETRON 4 MG PO TBDP: 4.0000 mg | ORAL_TABLET | Freq: Three times a day (TID) | ORAL | 0 refills | Status: AC | PRN

## 2022-10-04 MED ORDER — ONDANSETRON 4 MG PO TBDP
4.0000 mg | ORAL_TABLET | Freq: Once | ORAL | Status: AC
  Administered 2022-10-04: 4 mg via ORAL
  Filled 2022-10-04: qty 1

## 2022-10-04 NOTE — ED Provider Notes (Signed)
[facility] Provider Note    Event Date/Time   First MD Initiated Contact with Patient 10/04/22 2132     (approximate)   History   Emesis (diarr) and Diarrhea   HPI  Brandon Hampton is a 36 y.o. male who is otherwise healthy comes in with 3 days of diarrhea and vomiting.  Patient reports that honestly he is here just to get a COVID test.  He reports that the symptoms are overall very minimal but he wanted to make sure that it was not COVID.    He denies any abdominal pain.  Patient reports he had 3 episodes of diarrhea over the past 3 days that was loose stool.  He denies any travel out of [place] or recent antibiotics.  He also reports 2 episodes of vomiting as nonbloody nonbilious over last 3 days.  Denies any abdominal pain.  He reports that he came in today because he was concerned that he could have COVID.  He reports having COVID 2 months ago and that it was caught very late so he wanted to try to catch it early this time.  He denies any chest pain, shortness of breath.  Is tolerating eating and drinking.    Physical Exam   Triage Vital Signs: ED Triage Vitals [10/04/22 1934]  Enc Vitals Group     BP      Pulse      Resp      Temp      Temp src      SpO2      Weight 170 lb (77.1 kg)     Height      Head Circumference      Peak Flow      Pain Score 0     Pain Loc      Pain Edu?      Excl. in GC?     Most recent vital signs: Vitals:   10/04/22 2202  BP: 118/82  Pulse: 89  Resp: 20  Temp: 98 F (36.7 C)  SpO2: 98%     General: Awake, no distress.  CV:  Good peripheral perfusion.  Resp:  Normal effort.  Abd:  No distention.  Soft nontender Other:     ED Results / Procedures / Treatments   Labs (all labs ordered are listed, but only abnormal results are displayed) Labs Reviewed  SARS CORONAVIRUS 2 BY RT PCR      PROCEDURES:  Critical Care performed: No  Procedures   MEDICATIONS ORDERED IN ED: Medications -  No data to display   IMPRESSION / MDM / ASSESSMENT AND PLAN / ED COURSE  I reviewed the triage vital signs and the nursing notes.   Patient's presentation is most consistent with acute, uncomplicated illness.   Patient reports coming in for COVID testing.  Patient had positive COVID test 2 months ago.  Discussed with patient that given he was just +2 months ago the chance for reinfection is very low and that he could have a false positive COVID result due to recent infection.  He is also out of the window for flu treatment.  His symptoms seem very minimal in nature with only 3 episodes of diarrhea over 3 days and 2 episodes of vomiting.  We will give a dose of Zofran, p.o. challenge.  Vitals are reassuring we discussed blood work and have opted to want to hold off.  Abdomen is soft and nontender low suspicion for acute abdominal process  Repeat  evaluation his vital signs are reassuring.  Abdomen is soft and nontender.  Patient is tolerated p.o. with some Zofran.  We discussed blood work but given how well-appearing is patient's opted to decline.  Suspect most likely viral illness, gastroenteritis.  Patient will prescribe some Zofran given work notes, return to the ER if symptoms are worsening or any other concerns       FINAL CLINICAL IMPRESSION(S) / ED DIAGNOSES   Final diagnoses:  Gastroenteritis     Rx / DC Orders   ED Discharge Orders          Ordered    ondansetron (ZOFRAN-ODT) 4 MG disintegrating tablet  Every 8 hours PRN        10/04/22 2250             Note:  This document was prepared using Dragon voice recognition software and may include unintentional dictation errors.   Vanessa Malta, MD 10/04/22 2250

## 2022-10-04 NOTE — ED Triage Notes (Signed)
Pt presents via POV c/o diarrhea and emesis x2 days. Denies abd pain.

## 2022-10-04 NOTE — Discharge Instructions (Addendum)
Your vitals were reassuring and you were tolerating drinking with Zofran so we had discussed blood work but of opted to hold off if develop worsening symptoms with worsening diarrhea or vomiting please return to the ER so we can do blood work and decide if any imaging needs to be done

## 2023-11-03 ENCOUNTER — Ambulatory Visit: Payer: Self-pay

## 2023-11-08 ENCOUNTER — Ambulatory Visit: Payer: Self-pay

## 2023-12-21 ENCOUNTER — Ambulatory Visit: Payer: Self-pay

## 2024-07-22 ENCOUNTER — Encounter: Payer: Self-pay | Admitting: Emergency Medicine

## 2024-07-22 ENCOUNTER — Ambulatory Visit
Admission: EM | Admit: 2024-07-22 | Discharge: 2024-07-22 | Disposition: A | Discharge status: Home / Self Care | Payer: Self-pay | Attending: Emergency Medicine | Admitting: Emergency Medicine

## 2024-07-22 DIAGNOSIS — J069 Acute upper respiratory infection, unspecified: Secondary | ICD-10-CM | POA: Insufficient documentation

## 2024-07-22 LAB — GROUP A STREP BY PCR: Group A Strep by PCR: NOT DETECTED

## 2024-07-22 LAB — SARS CORONAVIRUS 2 BY RT PCR: SARS Coronavirus 2 by RT PCR: NEGATIVE

## 2024-07-22 MED ORDER — IPRATROPIUM BROMIDE 0.06 % NA SOLN: 2.0000 | Freq: Four times a day (QID) | NASAL | 12 refills | Status: AC

## 2024-07-22 NOTE — ED Provider Notes (Signed)
 [facility]    CSN: [id number] Arrival date & time: 07/22/24  1449      History   Chief Complaint Chief Complaint  Patient presents with   Covid Exposure   Sore Throat    HPI Brandon Hampton is a 38 y.o. male.   HPI  38 year old male with no significant past medical history presents for evaluation of sore throat and nasal congestion that started yesterday.  He reports that several people at his work are currently sick with COVID.  He denies any fever, nasal discharge, or cough.  History reviewed. No pertinent past medical history.  There are no active problems to display for this patient.   Past Surgical History:  Procedure Laterality Date   WISDOM TOOTH EXTRACTION         Home Medications    Prior to Admission medications   Medication Sig Start Date End Date Taking? Authorizing Provider  ipratropium (ATROVENT ) 0.06 % nasal spray Place 2 sprays into both nostrils 4 (four) times daily. 07/22/24  Yes Bernardino Ditch, NP    Family History History reviewed. No pertinent family history.  Social History Social History   Tobacco Use   Smoking status: Every Day    Types: E-cigarettes   Smokeless tobacco: Never   Tobacco comments:    Smoked cigarettes ~ 14 years. One pack cigarettes smoked every 2 - 3 days. Quit cigarettes 10/2020 and changed to vaping with nicotine daily.  Vaping Use   Vaping status: Every Day   Substances: Nicotine, Flavoring  Substance Use Topics   Alcohol use: Yes    Comment: weekends  3-4   Drug use: Yes    Frequency: 4.0 times per week    Types: Marijuana     Allergies   Septra [sulfamethoxazole-trimethoprim] and Sulfa antibiotics   Review of Systems Review of Systems  Constitutional:  Negative for fever.  HENT:  Positive for congestion and sore throat. Negative for ear pain and rhinorrhea.   Respiratory:  Negative for cough.      Physical Exam Triage Vital Signs ED Triage Vitals  Encounter Vitals Group     BP       Girls Systolic BP Percentile      Girls Diastolic BP Percentile      Boys Systolic BP Percentile      Boys Diastolic BP Percentile      Pulse      Resp      Temp      Temp src      SpO2      Weight      Height      Head Circumference      Peak Flow      Pain Score      Pain Loc      Pain Education      Exclude from Growth Chart    No data found.  Updated Vital Signs BP 122/85 (BP Location: Left Arm)   Pulse 71   Temp 98.7 F (37.1 C) (Oral)   Resp 16   Ht 6' 1 (1.854 m)   Wt 169 lb 15.6 oz (77.1 kg)   SpO2 98%   BMI 22.43 kg/m   Visual Acuity Right Eye Distance:   Left Eye Distance:   Bilateral Distance:    Right Eye Near:   Left Eye Near:    Bilateral Near:     Physical Exam Vitals and nursing note reviewed.  Constitutional:      Appearance: Normal appearance.  He is not ill-appearing.  HENT:     Head: Normocephalic and atraumatic.     Right Ear: Tympanic membrane, ear canal and external ear normal. There is no impacted cerumen.     Left Ear: Tympanic membrane, ear canal and external ear normal. There is no impacted cerumen.     Nose: Congestion and rhinorrhea present.     Comments: Please mucosa is edematous and erythematous with clear discharge in both knees.    Mouth/Throat:     Mouth: Mucous membranes are moist.     Pharynx: Oropharynx is clear. Posterior oropharyngeal erythema present. No oropharyngeal exudate.     Comments: Tonsillar pillars are recessed.  Erythema to the soft palate and posterior oropharynx.  No exudate. Cardiovascular:     Rate and Rhythm: Normal rate and regular rhythm.     Pulses: Normal pulses.     Heart sounds: Normal heart sounds. No murmur heard.    No friction rub. No gallop.  Pulmonary:     Effort: Pulmonary effort is normal.     Breath sounds: Normal breath sounds. No wheezing, rhonchi or rales.  Musculoskeletal:     Cervical back: Normal range of motion and neck supple. No tenderness.  Lymphadenopathy:      Cervical: No cervical adenopathy.  Skin:    General: Skin is warm and dry.     Capillary Refill: Capillary refill takes less than 2 seconds.     Findings: No rash.  Neurological:     General: No focal deficit present.     Mental Status: He is alert and oriented to person, place, and time.      UC Treatments / Results  Labs (all labs ordered are listed, but only abnormal results are displayed) Labs Reviewed  GROUP A STREP BY PCR  SARS CORONAVIRUS 2 BY RT PCR    EKG   Radiology No results found.  Procedures Procedures (including critical care time)  Medications Ordered in UC Medications - No data to display  Initial Impression / Assessment and Plan / UC Course  I have reviewed the triage vital signs and the nursing notes.  Pertinent labs & imaging results that were available during my care of the patient were reviewed by me and considered in my medical decision making (see chart for details).   Patient is a pleasant, nontoxic-appearing 44 old male presenting for evaluation of upper respiratory symptoms in the setting of a COVID exposure.  Due to the fact that he has been exposed to COVID I will order a COVID PCR.  Given that he is experiencing a sore throat he has erythema to the soft palate and posterior pharynx I will also order a strep PCR.  COVID PCR is negative.  Strep PCR is negative.  I will discharge patient with a diagnosis of viral URI with prescription retronasal spit up nasal congestion.  He may use over-the-counter Chloraseptic or Sucrets lozenges along with salt water gargles help soothe his throat.   Final Clinical Impressions(s) / UC Diagnoses   Final diagnoses:  Viral upper respiratory tract infection     Discharge Instructions      Your testing today was negative for both strep and COVID.  Based on your physical exam I do believe you have a viral respiratory infection.  Use over-the-counter Tylenol  and/or ibuprofen Corda pack instructions as  needed for any fever or pain.  Use the Atrovent  nasal spray, 2 squirts up each nostril every 6 hours, as needed for nasal congestion.  To soothe your throat you may gargle with warm salt water as often as you like.  Mix 1 tablespoon of table salt in 8 ounces of warm water, gargle and spit.  You may also use over-the-counter Chloraseptic or Sucrets lozenges.  No more than 1 lozenge every 2 hours as the menthol may give you diarrhea.  If you develop any new or worsening symptoms other return for reevaluation or see your primary care provider.     ED Prescriptions     Medication Sig Dispense Auth. Provider   ipratropium (ATROVENT ) 0.06 % nasal spray Place 2 sprays into both nostrils 4 (four) times daily. 15 mL Bernardino Ditch, NP      PDMP not reviewed this encounter.   Bernardino Ditch, NP 07/22/24 1600

## 2024-07-22 NOTE — ED Triage Notes (Signed)
 Pt c/o sore throat, nasal congestion. Started yesterday. He states a lot of people at his job are sick with covid.

## 2024-07-22 NOTE — Discharge Instructions (Addendum)
 Your testing today was negative for both strep and COVID.  Based on your physical exam I do believe you have a viral respiratory infection.  Use over-the-counter Tylenol  and/or ibuprofen Corda pack instructions as needed for any fever or pain.  Use the Atrovent  nasal spray, 2 squirts up each nostril every 6 hours, as needed for nasal congestion.  To soothe your throat you may gargle with warm salt water as often as you like.  Mix 1 tablespoon of table salt in 8 ounces of warm water, gargle and spit.  You may also use over-the-counter Chloraseptic or Sucrets lozenges.  No more than 1 lozenge every 2 hours as the menthol may give you diarrhea.  If you develop any new or worsening symptoms other return for reevaluation or see your primary care provider.

## 2024-09-01 ENCOUNTER — Emergency Department
Admission: EM | Admit: 2024-09-01 | Discharge: 2024-09-01 | Disposition: A | Discharge status: Home / Self Care | Payer: Self-pay | Attending: Emergency Medicine | Admitting: Emergency Medicine

## 2024-09-01 ENCOUNTER — Emergency Department: Payer: Self-pay

## 2024-09-01 ENCOUNTER — Other Ambulatory Visit: Payer: Self-pay

## 2024-09-01 DIAGNOSIS — S298XXD Other specified injuries of thorax, subsequent encounter: Secondary | ICD-10-CM

## 2024-09-01 DIAGNOSIS — S20211D Contusion of right front wall of thorax, subsequent encounter: Secondary | ICD-10-CM | POA: Insufficient documentation

## 2024-09-01 DIAGNOSIS — W108XXD Fall (on) (from) other stairs and steps, subsequent encounter: Secondary | ICD-10-CM | POA: Insufficient documentation

## 2024-09-01 MED ORDER — KETOROLAC TROMETHAMINE 30 MG/ML IJ SOLN
30.0000 mg | Freq: Once | INTRAMUSCULAR | Status: AC
  Administered 2024-09-01: 30 mg via INTRAMUSCULAR
  Filled 2024-09-01: qty 1

## 2024-09-01 MED ORDER — OXYCODONE-ACETAMINOPHEN 5-325 MG PO TABS
2.0000 | ORAL_TABLET | Freq: Once | ORAL | Status: AC
  Administered 2024-09-01: 2 via ORAL
  Filled 2024-09-01: qty 2

## 2024-09-01 MED ORDER — HYDROCODONE-ACETAMINOPHEN 5-325 MG PO TABS: 1.0000 | ORAL_TABLET | Freq: Four times a day (QID) | ORAL | 0 refills | Status: AC | PRN

## 2024-09-01 MED ORDER — LIDOCAINE 5 % EX PTCH: 1.0000 | MEDICATED_PATCH | Freq: Two times a day (BID) | CUTANEOUS | 0 refills | Status: AC

## 2024-09-01 NOTE — ED Provider Notes (Signed)
 [facility] Provider Note    Event Date/Time   First MD Initiated Contact with Patient 09/01/24 0530     (approximate)   History   Rib Injury   HPI Brandon Hampton is a 38 y.o. male who presents for persistent pain in the right side of his anterior inferior ribs.  About 1 week ago he fell down some stairs.  He went to the [facility] emergency department and had x-rays that were negative for fracture and he was told he had a rib contusion.  He has been taking ibuprofen and Tylenol  but the pain is bad enough that is keeping him from being able to sleep and it feels like it is hard to take deep breaths.  It has not gotten any worse but it has not improved over the last week.  He does not feel short of breath except that it hurts when he takes deep breaths.  He has no other chest pain.  The pain is at the very bottom of the right part of his rib cage.  It does not radiate around to his flank.  He feels better if he lies on his left side.     Physical Exam   Triage Vital Signs: ED Triage Vitals  Encounter Vitals Group     BP 09/01/24 0526 135/89     Girls Systolic BP Percentile --      Girls Diastolic BP Percentile --      Boys Systolic BP Percentile --      Boys Diastolic BP Percentile --      Pulse Rate 09/01/24 0526 73     Resp 09/01/24 0526 18     Temp 09/01/24 0525 98 F (36.7 C)     Temp Source 09/01/24 0525 Oral     SpO2 09/01/24 0526 100 %     Weight 09/01/24 0526 77.1 kg (170 lb)     Height 09/01/24 0526 1.854 m (6' 1)     Head Circumference --      Peak Flow --      Pain Score 09/01/24 0526 8     Pain Loc --      Pain Education --      Exclude from Growth Chart --     Most recent vital signs: Vitals:   09/01/24 0525 09/01/24 0526  BP:  135/89  Pulse:  73  Resp:  18  Temp: 98 F (36.7 C)   SpO2:  100%    General: Awake, appears quite uncomfortable but generally healthy. CV:  Good peripheral perfusion.  Regular rate and  rhythm. Resp:  The patient is splinting a bit due to the pain in his ribs but his lungs are clear to auscultation bilaterally and his respiratory rate is normal. Abd:  No distention.  Healthy and muscular body habitus.  No tenderness to palpation of the abdomen. Other:  Easily reproducible anterior inferior right-sided chest wall pain just above the diaphragm.  No associated abdominal tenderness.  No tenderness to the right lateral or posterior ribs.  No palpable deformities.   ED Results / Procedures / Treatments   Labs (all labs ordered are listed, but only abnormal results are displayed) Labs Reviewed - No data to display    RADIOLOGY See ED course for details   PROCEDURES:  Critical Care performed: No  Procedures    IMPRESSION / MDM / ASSESSMENT AND PLAN / ED COURSE  I reviewed the triage vital signs and the nursing notes.  Differential diagnosis includes, but is not limited to, rib contusion, rib fracture, pneumothorax, intrathoracic bleed, liver injury, diaphragmatic injury.  Patient's presentation is most consistent with acute complicated illness / injury requiring diagnostic workup.  Labs/studies ordered: Rib x-rays with CXR  Interventions/Medications given:  Medications  oxyCODONE -acetaminophen  (PERCOCET/ROXICET) 5-325 MG per tablet 2 tablet (2 tablets Oral Given 09/01/24 9385)  ketorolac  (TORADOL ) 30 MG/ML injection 30 mg (30 mg Intramuscular Given 09/01/24 0615)    (Note:  hospital course my include additional interventions and/or labs/studies not listed above.)   Patient is in a significant amount of discomfort but has normal vital signs.  Easily reproducible tenderness to palpation.  Lungs are clear and equal.  Doubt pneumothorax or intrathoracic/intra-abdominal bleeding.  Patient has no tenderness of the liver itself but the tenderness is isolated to palpation of the ribs.  I ordered 2 Percocet and Toradol  30 mg intramuscular and  we will evaluate with rib and chest x-rays to see if there are any changes over the last week that would suggest that he has 1 or more fractures.  No indication for CT scan at this time.  Will discuss additional methods for pain control after reviewing the imaging.     Clinical Course as of 09/01/24 9293  Sat Sep 01, 2024  0704 DG Ribs Unilateral W/Chest Right I independently viewed and interpreted the patient's rib and chest x-rays and I see no evidence of fracture nor pneumonia.  Confirmed by radiology. [CF]  0704 I reassessed the patient.  He is still having some pain but he feels better.  I went over management recommendations and return precautions and usage of incentive spirometer.  I referred him to primary care and gave my usual and customary return precautions.  The patient's medical screening exam is reassuring with no indication of an emergent medical condition requiring hospitalization or additional evaluation at this point.  The patient is safe and appropriate for discharge and outpatient follow up. [CF]    Clinical Course User Index [CF] Gordan Huxley, MD     FINAL CLINICAL IMPRESSION(S) / ED DIAGNOSES   Final diagnoses:  Contusion of rib, subsequent encounter     Rx / DC Orders   ED Discharge Orders          Ordered    Ambulatory Referral to Primary Care (Establish Care)        09/01/24 0638    lidocaine  (LIDODERM ) 5 %  Every 12 hours        09/01/24 0640    HYDROcodone-acetaminophen  (NORCO/VICODIN) 5-325 MG tablet  Every 6 hours PRN        09/01/24 0640             Note:  This document was prepared using Dragon voice recognition software and may include unintentional dictation errors.   Gordan Huxley, MD 09/01/24 [phone]

## 2024-09-01 NOTE — ED Triage Notes (Addendum)
 Pt presented to ED with c/o right sided rib pain after falling down stairs 9/26. States was seen at [facility] and told ribs were bruised, told to take tylenol  and ibuprofen for pain. Pt states taking OTC meds without relief of pain. Pain 8/10. States last took Tylenol  at 2200.

## 2024-09-01 NOTE — Discharge Instructions (Addendum)
 Although there is no sign that you have any rib fractures, it can hurt for a long time when you have had a deep bruise to your ribs.  It takes quite a while to heal.  Please continue to take over-the-counter ibuprofen and Tylenol  according to label instructions.  Take Norco as prescribed for severe pain. Do not drink alcohol, drive or participate in any other potentially dangerous activities while taking this medication as it may make you sleepy. Do not take this medication with any other sedating medications, either prescription or over-the-counter. If you were prescribed Percocet or Vicodin, do not take these with acetaminophen  (Tylenol ) as it is already contained within these medications.   This medication is an opiate (or narcotic) pain medication and can be habit forming.  Use it as little as possible to achieve adequate pain control.  Do not use or use it with extreme caution if you have a history of opiate abuse or dependence.  If you are on a pain contract with your primary care doctor or a pain specialist, be sure to let them know you were prescribed this medication today from the [facility] Emergency Department.  This medication is intended for your use only - do not give any to anyone else and keep it in a secure place where nobody else, especially children, have access to it.  It will also cause or worsen constipation, so you may want to consider taking an over-the-counter stool softener while you are taking this medication.  Do not use the stronger pain medication anymore than you have; this is a one-time prescription that should help during this acute phase, but you should use over-the-counter medication is much as you can  You should also use the prescribed Lidoderm  patches which you put right over the area that hurts the most and can help decrease the pain.  Also use the incentive spirometer that we gave you to help ensure that you are taking deep breaths despite the pain so that she did  not develop pneumonia.

## 2024-09-01 NOTE — ED Notes (Signed)
 Pt provided with incentive spirometer and given education on proper use.  Pt verbalized and demonstrated understanding.

## 2024-09-07 ENCOUNTER — Ambulatory Visit: Payer: Self-pay

## 2024-09-07 DIAGNOSIS — Z202 Contact with and (suspected) exposure to infections with a predominantly sexual mode of transmission: Secondary | ICD-10-CM

## 2024-09-07 DIAGNOSIS — Z113 Encounter for screening for infections with a predominantly sexual mode of transmission: Secondary | ICD-10-CM

## 2024-09-07 MED ORDER — METRONIDAZOLE 500 MG PO TABS: 2000.0000 mg | ORAL_TABLET | Freq: Once | ORAL | Status: AC

## 2024-09-07 NOTE — Progress Notes (Signed)
 [facility] Department STI clinic [address] [place] [address] Main phone: [phone]  STI screening visit  Subjective:  Brandon Hampton is a 38 y.o. male being seen today for an STI screening visit. The patient reports they do not have symptoms.    Patient has the following medical conditions:  There are no active problems to display for this patient.  Chief Complaint  Patient presents with   SEXUALLY TRANSMITTED DISEASE   HPI Patient reports concern regarding STI exposure due to his partner having symptoms. He reports she is being seen today for STI testing as well. He has no symptoms.  See flowsheet for further details and programmatic requirements  Hyperlink available at the top of the signed note in blue.  Flow sheet content below:  Pregnancy Intention Screening Does the patient want to become pregnant in the next year?: No Does the patient's partner want to become pregnant in the next year?: No Would the patient like to discuss contraceptive options today?: N/A Reason For STD Screen STD Screening: Is asymptomatic Have you ever had an STD?: Yes History of Antibiotic use in the past 2 weeks?: No STD Symptoms Denies all: Yes Risk Factors for Hep B Household, sexual, or needle sharing contact of a person infected with Hep B: No Sexual contact with a person who uses drugs not as prescribed?: No Currently or Ever used drugs not as prescribed: No HIV Positive: No PRep Patient: No Men who have sex with men: No Have Hepatitis C: No History of Incarceration: Yes History of Homeslessness?: Yes Anal sex following anal drug use?: No Risk Factors for Hep C Currently using drugs not as prescribed: No Sexual partner(s) currently using drugs as not prescribed: No History of drug use: No HIV Positive: No People with a history of incarceration: Yes People born between the years of 28 and 71: No Hepatitis Counseling Hep B  Counseling: Counseled patient about increased risk of Hep B and recommendation for testing, Patient accepts testing for Hep B today Hep C Counseling: Patient accepts testing for Hep C today, Counseled patient about increased risk of Hep C and recommendation for testing Counseling Patient counseled to use condoms with all sex: Condoms given RTC in 2-3 weeks for test results: Yes Clinic will call if test results abnormal before test result appt.: Yes Test results given to patient Patient counseled to use condoms with all sex: Condoms given  Screening for MPX risk:  Unexplained rash?  No   MSM?  No   Multiple or anonymous sex partners?  No   Any close or sexual contact with a person  diagnosed with MPX?  No   Any outside the [place]  where MPX is endemic?  No   High clinical suspicion for MPX?    -Unlikely to be chickenpox    -Lymphadenopathy    -Rash that presents in same phase of       evolution on any given body part  No   STI screening history: Last HIV test per patient/review of record was No results found for: HMHIVSCREEN  Lab Results  Component Value Date   HIV Non Reactive 04/02/2022   Last HEPC test per patient/review of record was No results found for: HMHEPCSCREEN No components found for: HEPC   Last HEPB test per patient/review of record was No components found for: HMHEPBSCREEN   Fertility: Does the patient or their partner desires a pregnancy in the next year? No  Immunization History  Administered Date(s)  Administered   Tdap 04/30/2019    The following portions of the patient's history were reviewed and updated as appropriate: allergies, current medications, past medical history, past social history, past surgical history and problem list.  Objective:  There were no vitals filed for this visit.  Physical Exam Constitutional:      Appearance: Normal appearance.  HENT:     Head: Normocephalic.     Mouth/Throat:     Lips: Pink. No lesions.     Mouth:  Mucous membranes are moist.     Pharynx: Oropharynx is clear. No pharyngeal swelling or oropharyngeal exudate.     Tonsils: No tonsillar exudate.  Eyes:     General: No scleral icterus.       Right eye: No discharge.        Left eye: No discharge.  Pulmonary:     Effort: Pulmonary effort is normal.  Lymphadenopathy:     Head:     Right side of head: No submental, submandibular, tonsillar, preauricular or posterior auricular adenopathy.     Left side of head: No submental, submandibular, tonsillar, preauricular or posterior auricular adenopathy.     Cervical: No cervical adenopathy.     Right cervical: No superficial or posterior cervical adenopathy.    Left cervical: No superficial or posterior cervical adenopathy.  Skin:    General: Skin is warm and dry.  Neurological:     General: No focal deficit present.     Mental Status: He is alert.  Psychiatric:        Mood and Affect: Mood normal.        Behavior: Behavior normal.    Assessment and Plan:  Brandon Hampton is a 38 y.o. male presenting to the [facility] Department for STI screening  1. Screening for venereal disease (Primary)  - Chlamydia/GC NAA, Confirmation - HIV/RPR/hep B/hep C ordered but not collected due to difficult blood draw  2. Trichomonas contact  - [name] notified while still here in clinic by his partner that she tested positive for trichomonas - metroNIDAZOLE (FLAGYL) 500 MG tablet; Take 4 tablets (2,000 mg total) by mouth once for 1 dose.   Patient does not have STI symptoms Patient accepted the following screenings: urine CT/GC, HIV, RPR, Hep B, and Hep C Patient meets criteria for HepB screening? Yes. Ordered? yes Patient meets criteria for HepC screening? Yes. Ordered? Yes Blood tests were ordered but not obtained today, phlebotomist attempted x2 and then patient declined further attempts. Recommended condom use with all sex Discussed importance of condom use for STI prevention  Treat  positive test results per standing order. Discussed time line for [facility] results and that patient will be called with positive results and encouraged patient to call if he had not heard in 2 weeks Recommended repeat testing in 3 months with positive results. Recommended returning for continued or worsening symptoms.   Return in about 3 months (around 12/08/2024) for STI testing.  No future appointments.  Damien FORBES Satchel, NP

## 2024-09-07 NOTE — Progress Notes (Signed)
 Pt is here for STD screening and a contact to Trichomonas. The patient was dispensed metronidazole 2000 mg tablets once. I provided counseling today regarding the medication, the side effects and when to call clinic. Patient was given the opportunity to ask questions for any clarifications. Questions answered. Brochure and condoms given. Wilkie Drought, RN.

## 2024-09-10 LAB — CHLAMYDIA/GC NAA, CONFIRMATION
Chlamydia trachomatis, NAA: NEGATIVE
Neisseria gonorrhoeae, NAA: NEGATIVE
